# Patient Record
Sex: Female | Born: 1995 | Race: White | Hispanic: No | Marital: Single | Smoking: Former smoker
Health system: Southern US, Community
[De-identification: ages and names within clinical notes are randomized; demographics above are authoritative.]

## PROBLEM LIST (undated history)

## (undated) DIAGNOSIS — N390 Urinary tract infection, site not specified: Secondary | ICD-10-CM

## (undated) DIAGNOSIS — F419 Anxiety disorder, unspecified: Secondary | ICD-10-CM

## (undated) DIAGNOSIS — F32A Depression, unspecified: Secondary | ICD-10-CM

## (undated) HISTORY — PX: APPENDECTOMY: SHX54

---
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
## 2020-12-22 ENCOUNTER — Emergency Department (HOSPITAL_BASED_OUTPATIENT_CLINIC_OR_DEPARTMENT_OTHER)
Admission: EM | Admit: 2020-12-22 | Discharge: 2020-12-22 | Disposition: A | Discharge status: Home / Self Care | Payer: Commercial Managed Care - PPO | Attending: Emergency Medicine | Admitting: Emergency Medicine

## 2020-12-22 ENCOUNTER — Encounter (HOSPITAL_BASED_OUTPATIENT_CLINIC_OR_DEPARTMENT_OTHER): Payer: Self-pay | Admitting: *Deleted

## 2020-12-22 ENCOUNTER — Other Ambulatory Visit: Payer: Self-pay

## 2020-12-22 DIAGNOSIS — R112 Nausea with vomiting, unspecified: Secondary | ICD-10-CM | POA: Diagnosis present

## 2020-12-22 DIAGNOSIS — R109 Unspecified abdominal pain: Secondary | ICD-10-CM | POA: Insufficient documentation

## 2020-12-22 HISTORY — DX: Depression, unspecified: F32.A

## 2020-12-22 LAB — CBC
HCT: 43.7 % (ref 36.0–46.0)
Hemoglobin: 14.3 g/dL (ref 12.0–15.0)
MCH: 27.5 pg (ref 26.0–34.0)
MCHC: 32.7 g/dL (ref 30.0–36.0)
MCV: 84 fL (ref 80.0–100.0)
Platelets: 378 10*3/uL (ref 150–400)
RBC: 5.2 MIL/uL — ABNORMAL HIGH (ref 3.87–5.11)
RDW: 13.4 % (ref 11.5–15.5)
WBC: 17.1 10*3/uL — ABNORMAL HIGH (ref 4.0–10.5)
nRBC: 0 % (ref 0.0–0.2)

## 2020-12-22 LAB — URINALYSIS, ROUTINE W REFLEX MICROSCOPIC
Bilirubin Urine: NEGATIVE
Glucose, UA: NEGATIVE mg/dL
Hgb urine dipstick: NEGATIVE
Ketones, ur: 40 mg/dL — AB
Leukocytes,Ua: NEGATIVE
Nitrite: NEGATIVE
Protein, ur: NEGATIVE mg/dL
Specific Gravity, Urine: 1.01 (ref 1.005–1.030)
pH: 8 (ref 5.0–8.0)

## 2020-12-22 LAB — COMPREHENSIVE METABOLIC PANEL
ALT: 20 U/L (ref 0–44)
AST: 27 U/L (ref 15–41)
Albumin: 4.7 g/dL (ref 3.5–5.0)
Alkaline Phosphatase: 78 U/L (ref 38–126)
Anion gap: 10 (ref 5–15)
BUN: 16 mg/dL (ref 6–20)
CO2: 25 mmol/L (ref 22–32)
Calcium: 9.4 mg/dL (ref 8.9–10.3)
Chloride: 105 mmol/L (ref 98–111)
Creatinine, Ser: 0.57 mg/dL (ref 0.44–1.00)
GFR, Estimated: >60 mL/min (ref >60)
Glucose, Bld: 114 mg/dL — ABNORMAL HIGH (ref 70–99)
Potassium: 3.3 mmol/L — ABNORMAL LOW (ref 3.5–5.1)
Sodium: 140 mmol/L (ref 135–145)
Total Bilirubin: 0.4 mg/dL (ref 0.3–1.2)
Total Protein: 7.9 g/dL (ref 6.5–8.1)

## 2020-12-22 LAB — LIPASE, BLOOD: Lipase: 21 U/L (ref 11–51)

## 2020-12-22 LAB — PREGNANCY, URINE: Preg Test, Ur: NEGATIVE

## 2020-12-22 MED ORDER — ONDANSETRON 4 MG PO TBDP: 8.0000 mg | ORAL_TABLET | Freq: Once | ORAL | Status: DC

## 2020-12-22 MED ORDER — ONDANSETRON 4 MG PO TBDP
4.0000 mg | ORAL_TABLET | Freq: Once | ORAL | Status: AC
  Administered 2020-12-22: 4 mg via ORAL
  Filled 2020-12-22: qty 1

## 2020-12-22 MED ORDER — ONDANSETRON 4 MG PO TBDP: 4.0000 mg | ORAL_TABLET | Freq: Three times a day (TID) | ORAL | 0 refills | Status: DC | PRN

## 2020-12-22 NOTE — ED Triage Notes (Signed)
Patient has been drinking wine and Saki last night and was vomiting since then until this morning.

## 2020-12-22 NOTE — ED Notes (Signed)
MD at the bedside  

## 2020-12-22 NOTE — ED Notes (Signed)
Patient Alert and oriented to baseline. Stable and ambulatory to baseline. Patient verbalized understanding of the discharge instructions.  Patient belongings were taken by the patient.   

## 2020-12-22 NOTE — ED Provider Notes (Signed)
[facility] EMERGENCY DEPARTMENT Provider Note  CSN: [id number] Arrival date & time: 12/22/20 1024    History Chief Complaint  Patient presents with   Emesis    Helen Foster is a 25 y.o. female with no significant PMH reports she had been drinking alcohol last night began vomiting during the night and into this morning. Some abdominal cramping with vomiting but otherwise no abdominal pain, hematemesis or hematochezia/melena. She is feeling some better now. No fever, cough, CP or SOB.    Past Medical History:  Diagnosis Date   Depression     History reviewed. No pertinent surgical history.  History reviewed. No pertinent family history.  Social History   Tobacco Use   Smoking status: Never   Smokeless tobacco: Never  Vaping Use   Vaping Use: Never used  Substance Use Topics   Alcohol use: No   Drug use: Not Currently    Types: Marijuana     Home Medications Prior to Admission medications   Medication Sig Start Date End Date Taking? Authorizing Provider  famotidine (PEPCID) 20 MG tablet Take 20 mg by mouth 2 (two) times daily.   Yes [provider]  FLUoxetine (PROZAC) 10 MG tablet Take 10 mg by mouth daily.   Yes [provider]  ondansetron (ZOFRAN ODT) 4 MG disintegrating tablet Take 1 tablet (4 mg total) by mouth every 8 (eight) hours as needed for nausea or vomiting. 12/22/20  Yes Pollyann Savoy, MD  phenazopyridine (PYRIDIUM) 200 MG tablet Take 1 tablet (200 mg total) by mouth 3 (three) times daily. 12/02/13   Molpus, Jonny Ruiz, MD     Allergies    Patient has no known allergies.   Review of Systems   Review of Systems A comprehensive review of systems was completed and negative except as noted in HPI.    Physical Exam BP 121/74   Pulse 67   Temp 97.7 F (36.5 C) (Oral)   Resp 18   Ht 5\' 1"  (1.549 m)   Wt 52.2 kg   LMP 12/15/2020   SpO2 97%   BMI 21.73 kg/m   Physical Exam Vitals and nursing note reviewed.   Constitutional:      Appearance: Normal appearance.  HENT:     Head: Normocephalic and atraumatic.     Nose: Nose normal.     Mouth/Throat:     Mouth: Mucous membranes are moist.  Eyes:     Extraocular Movements: Extraocular movements intact.     Conjunctiva/sclera: Conjunctivae normal.  Cardiovascular:     Rate and Rhythm: Normal rate.  Pulmonary:     Effort: Pulmonary effort is normal.     Breath sounds: Normal breath sounds.  Abdominal:     General: Abdomen is flat.     Palpations: Abdomen is soft.     Tenderness: There is no abdominal tenderness.  Musculoskeletal:        General: No swelling. Normal range of motion.     Cervical back: Neck supple.  Skin:    General: Skin is warm and dry.  Neurological:     General: No focal deficit present.     Mental Status: She is alert.  Psychiatric:        Mood and Affect: Mood normal.     ED Results / Procedures / Treatments   Labs (all labs ordered are listed, but only abnormal results are displayed) Labs Reviewed  COMPREHENSIVE METABOLIC PANEL - Abnormal; Notable for the following components:  Result Value   Potassium 3.3 (*)    Glucose, Bld 114 (*)    All other components within normal limits  CBC - Abnormal; Notable for the following components:   WBC 17.1 (*)    RBC 5.20 (*)    All other components within normal limits  URINALYSIS, ROUTINE W REFLEX MICROSCOPIC - Abnormal; Notable for the following components:   Ketones, ur 40 (*)    All other components within normal limits  LIPASE, BLOOD  PREGNANCY, URINE    EKG None   Radiology No results found.  Procedures Procedures  Medications Ordered in the ED Medications  ondansetron (ZOFRAN-ODT) disintegrating tablet 4 mg (4 mg Oral Given 12/22/20 1241)     MDM Rules/Calculators/A&P MDM Labs ordered in triage reviewed, mild leukocytosis and mod ketones in urine but otherwise unremarkable. She is feeling better after ODT Zofran, will give PO trial and  anticipate discharge if she tolerates.   ED Course  I have reviewed the triage vital signs and the nursing notes.  Pertinent labs & imaging results that were available during my care of the patient were reviewed by me and considered in my medical decision making (see chart for details).  Clinical Course as of 12/22/20 1324  Sun Dec 22, 2020  1322 Patient tolerating PO fluids and ready to go home. Rx for Zofran as needed. RTED for any other concerns.  [CS]    Clinical Course User Index [CS] Pollyann Savoy, MD    Final Clinical Impression(s) / ED Diagnoses Final diagnoses:  Non-intractable vomiting with nausea, unspecified vomiting type    Rx / DC Orders ED Discharge Orders          Ordered    ondansetron (ZOFRAN ODT) 4 MG disintegrating tablet  Every 8 hours PRN        12/22/20 1323             Pollyann Savoy, MD 12/22/20 1324

## 2022-04-06 NOTE — L&D Delivery Note (Signed)
Delivery Note After approximately 20 minutes of pushing, at 6:58 PM a viable female "Helen Foster" was delivered via Vaginal, Spontaneous (Presentation:   Occiput Anterior).  APGAR: 9, 9; weight pending. Head crowned and shoulders delivered easily within the same contraction. An umbilical cord was noted around the infants left ankle. The cord was clamped and cut after 1 minute and baby placed on mother's abdomen.   Placenta status: Spontaneous, Intact.  Cord: 3 vessels with the following complications: None.  Cord pH: N/A  Anesthesia: Epidural Episiotomy: None Lacerations: 2nd degree;Perineal Suture Repair: 2.0 Est. Blood Loss (mL): 150  Mom to postpartum.  Baby to Couplet care / Skin to Skin.  Junius Creamer 03/10/2023, 7:16 PM

## 2022-08-26 LAB — OB RESULTS CONSOLE HIV ANTIBODY (ROUTINE TESTING): HIV: NONREACTIVE

## 2022-08-26 LAB — OB RESULTS CONSOLE RPR: RPR: NONREACTIVE

## 2022-08-26 LAB — OB RESULTS CONSOLE RUBELLA ANTIBODY, IGM: Rubella: IMMUNE

## 2022-08-26 LAB — OB RESULTS CONSOLE GC/CHLAMYDIA
Chlamydia: NEGATIVE
Neisseria Gonorrhea: NEGATIVE

## 2022-08-26 LAB — OB RESULTS CONSOLE HEPATITIS B SURFACE ANTIGEN: Hepatitis B Surface Ag: NEGATIVE

## 2023-02-18 LAB — OB RESULTS CONSOLE GBS: GBS: NEGATIVE

## 2023-03-10 ENCOUNTER — Inpatient Hospital Stay (HOSPITAL_COMMUNITY)
Admission: AD | Admit: 2023-03-10 | Discharge: 2023-03-10 | Disposition: A | Discharge status: Home / Self Care | Payer: Commercial Managed Care - PPO | Source: Home / Self Care | Attending: Obstetrics and Gynecology | Admitting: Obstetrics and Gynecology

## 2023-03-10 ENCOUNTER — Inpatient Hospital Stay (HOSPITAL_COMMUNITY)
Admission: AD | Admit: 2023-03-10 | Discharge: 2023-03-12 | DRG: 807 | Disposition: A | Discharge status: Home / Self Care | Payer: Commercial Managed Care - PPO | Attending: Student | Admitting: Student

## 2023-03-10 ENCOUNTER — Encounter (HOSPITAL_COMMUNITY): Payer: Self-pay | Admitting: Obstetrics and Gynecology

## 2023-03-10 ENCOUNTER — Inpatient Hospital Stay (HOSPITAL_COMMUNITY): Payer: Commercial Managed Care - PPO | Admitting: Anesthesiology

## 2023-03-10 ENCOUNTER — Encounter (HOSPITAL_COMMUNITY): Payer: Self-pay | Admitting: Student

## 2023-03-10 ENCOUNTER — Other Ambulatory Visit: Payer: Self-pay

## 2023-03-10 DIAGNOSIS — O99344 Other mental disorders complicating childbirth: Secondary | ICD-10-CM | POA: Diagnosis present

## 2023-03-10 DIAGNOSIS — O479 False labor, unspecified: Secondary | ICD-10-CM

## 2023-03-10 DIAGNOSIS — Z87891 Personal history of nicotine dependence: Secondary | ICD-10-CM

## 2023-03-10 DIAGNOSIS — Z3A39 39 weeks gestation of pregnancy: Secondary | ICD-10-CM | POA: Insufficient documentation

## 2023-03-10 DIAGNOSIS — O9962 Diseases of the digestive system complicating childbirth: Secondary | ICD-10-CM | POA: Diagnosis present

## 2023-03-10 DIAGNOSIS — O471 False labor at or after 37 completed weeks of gestation: Secondary | ICD-10-CM | POA: Insufficient documentation

## 2023-03-10 DIAGNOSIS — O9902 Anemia complicating childbirth: Secondary | ICD-10-CM | POA: Diagnosis present

## 2023-03-10 DIAGNOSIS — O26893 Other specified pregnancy related conditions, third trimester: Secondary | ICD-10-CM | POA: Diagnosis present

## 2023-03-10 DIAGNOSIS — K219 Gastro-esophageal reflux disease without esophagitis: Secondary | ICD-10-CM | POA: Diagnosis present

## 2023-03-10 DIAGNOSIS — F603 Borderline personality disorder: Secondary | ICD-10-CM | POA: Diagnosis present

## 2023-03-10 HISTORY — DX: Anxiety disorder, unspecified: F41.9

## 2023-03-10 HISTORY — DX: Urinary tract infection, site not specified: N39.0

## 2023-03-10 LAB — TYPE AND SCREEN
ABO/RH(D): O POS
Antibody Screen: NEGATIVE

## 2023-03-10 LAB — CBC
HCT: 33.9 % — ABNORMAL LOW (ref 36.0–46.0)
Hemoglobin: 10.7 g/dL — ABNORMAL LOW (ref 12.0–15.0)
MCH: 25.7 pg — ABNORMAL LOW (ref 26.0–34.0)
MCHC: 31.6 g/dL (ref 30.0–36.0)
MCV: 81.5 fL (ref 80.0–100.0)
Platelets: 257 10*3/uL (ref 150–400)
RBC: 4.16 MIL/uL (ref 3.87–5.11)
RDW: 16.3 % — ABNORMAL HIGH (ref 11.5–15.5)
WBC: 15.7 10*3/uL — ABNORMAL HIGH (ref 4.0–10.5)
nRBC: 0 % (ref 0.0–0.2)

## 2023-03-10 LAB — RPR: RPR Ser Ql: NONREACTIVE

## 2023-03-10 MED ORDER — SIMETHICONE 80 MG PO CHEW: 80.0000 mg | CHEWABLE_TABLET | ORAL | Status: DC | PRN

## 2023-03-10 MED ORDER — FLUOXETINE HCL 20 MG PO CAPS
20.0000 mg | ORAL_CAPSULE | Freq: Every day | ORAL | Status: DC
  Filled 2023-03-10: qty 1

## 2023-03-10 MED ORDER — ONDANSETRON HCL 4 MG PO TABS: 4.0000 mg | ORAL_TABLET | ORAL | Status: DC | PRN

## 2023-03-10 MED ORDER — ACETAMINOPHEN 325 MG PO TABS
650.0000 mg | ORAL_TABLET | ORAL | Status: DC | PRN
  Administered 2023-03-11: 650 mg via ORAL
  Filled 2023-03-10: qty 2

## 2023-03-10 MED ORDER — FLEET ENEMA RE ENEM: 1.0000 | ENEMA | RECTAL | Status: DC | PRN

## 2023-03-10 MED ORDER — FENTANYL-BUPIVACAINE-NACL 0.5-0.125-0.9 MG/250ML-% EP SOLN
EPIDURAL | Status: AC
  Filled 2023-03-10: qty 250

## 2023-03-10 MED ORDER — LACTATED RINGERS IV SOLN: INTRAVENOUS | Status: DC

## 2023-03-10 MED ORDER — TETANUS-DIPHTH-ACELL PERTUSSIS 5-2.5-18.5 LF-MCG/0.5 IM SUSY
0.5000 mL | PREFILLED_SYRINGE | Freq: Once | INTRAMUSCULAR | Status: DC
  Filled 2023-03-10: qty 0.5

## 2023-03-10 MED ORDER — PRENATAL MULTIVITAMIN CH
1.0000 | ORAL_TABLET | Freq: Every day | ORAL | Status: DC
  Administered 2023-03-11 – 2023-03-12 (×2): 1 via ORAL
  Filled 2023-03-10 (×2): qty 1

## 2023-03-10 MED ORDER — PHENYLEPHRINE 80 MCG/ML (10ML) SYRINGE FOR IV PUSH (FOR BLOOD PRESSURE SUPPORT): 80.0000 ug | PREFILLED_SYRINGE | INTRAVENOUS | Status: DC | PRN

## 2023-03-10 MED ORDER — LAMOTRIGINE 100 MG PO TABS
100.0000 mg | ORAL_TABLET | Freq: Every day | ORAL | Status: DC
Start: 2023-03-10 — End: 2023-03-10
  Filled 2023-03-10: qty 1

## 2023-03-10 MED ORDER — OXYTOCIN BOLUS FROM INFUSION
333.0000 mL | Freq: Once | INTRAVENOUS | Status: AC
  Administered 2023-03-10: 333 mL via INTRAVENOUS

## 2023-03-10 MED ORDER — ONDANSETRON HCL 4 MG/2ML IJ SOLN: 4.0000 mg | INTRAMUSCULAR | Status: DC | PRN

## 2023-03-10 MED ORDER — BENZOCAINE-MENTHOL 20-0.5 % EX AERO: 1.0000 | INHALATION_SPRAY | CUTANEOUS | Status: DC | PRN

## 2023-03-10 MED ORDER — OXYTOCIN-SODIUM CHLORIDE 30-0.9 UT/500ML-% IV SOLN
1.0000 m[IU]/min | INTRAVENOUS | Status: DC
  Administered 2023-03-10: 2 m[IU]/min via INTRAVENOUS

## 2023-03-10 MED ORDER — WITCH HAZEL-GLYCERIN EX PADS: 1.0000 | MEDICATED_PAD | CUTANEOUS | Status: DC | PRN

## 2023-03-10 MED ORDER — DIPHENHYDRAMINE HCL 25 MG PO CAPS: 25.0000 mg | ORAL_CAPSULE | Freq: Four times a day (QID) | ORAL | Status: DC | PRN

## 2023-03-10 MED ORDER — ZOLPIDEM TARTRATE 5 MG PO TABS: 5.0000 mg | ORAL_TABLET | Freq: Every evening | ORAL | Status: DC | PRN

## 2023-03-10 MED ORDER — IBUPROFEN 600 MG PO TABS
600.0000 mg | ORAL_TABLET | Freq: Four times a day (QID) | ORAL | Status: DC
  Administered 2023-03-11 – 2023-03-12 (×6): 600 mg via ORAL
  Filled 2023-03-10 (×6): qty 1

## 2023-03-10 MED ORDER — OXYCODONE-ACETAMINOPHEN 5-325 MG PO TABS
1.0000 | ORAL_TABLET | Freq: Four times a day (QID) | ORAL | Status: DC | PRN
Start: 2023-03-10 — End: 2023-03-12

## 2023-03-10 MED ORDER — LIDOCAINE HCL (PF) 1 % IJ SOLN: 30.0000 mL | INTRAMUSCULAR | Status: DC | PRN

## 2023-03-10 MED ORDER — DIPHENHYDRAMINE HCL 50 MG/ML IJ SOLN: 12.5000 mg | INTRAMUSCULAR | Status: DC | PRN

## 2023-03-10 MED ORDER — LACTATED RINGERS IV SOLN: 500.0000 mL | Freq: Once | INTRAVENOUS | Status: DC

## 2023-03-10 MED ORDER — SENNOSIDES-DOCUSATE SODIUM 8.6-50 MG PO TABS
2.0000 | ORAL_TABLET | Freq: Every day | ORAL | Status: DC
  Administered 2023-03-11 – 2023-03-12 (×2): 2 via ORAL
  Filled 2023-03-10 (×2): qty 2

## 2023-03-10 MED ORDER — OXYTOCIN-SODIUM CHLORIDE 30-0.9 UT/500ML-% IV SOLN
2.5000 [IU]/h | INTRAVENOUS | Status: DC
  Filled 2023-03-10: qty 500

## 2023-03-10 MED ORDER — EPHEDRINE 5 MG/ML INJ: 10.0000 mg | INTRAVENOUS | Status: DC | PRN

## 2023-03-10 MED ORDER — FENTANYL-BUPIVACAINE-NACL 0.5-0.125-0.9 MG/250ML-% EP SOLN
12.0000 mL/h | EPIDURAL | Status: DC | PRN
  Administered 2023-03-10: 12 mL/h via EPIDURAL

## 2023-03-10 MED ORDER — OXYCODONE-ACETAMINOPHEN 5-325 MG PO TABS
2.0000 | ORAL_TABLET | ORAL | Status: DC | PRN
Start: 2023-03-10 — End: 2023-03-10

## 2023-03-10 MED ORDER — ACETAMINOPHEN 325 MG PO TABS: 650.0000 mg | ORAL_TABLET | ORAL | Status: DC | PRN

## 2023-03-10 MED ORDER — ONDANSETRON HCL 4 MG/2ML IJ SOLN
4.0000 mg | Freq: Four times a day (QID) | INTRAMUSCULAR | Status: DC | PRN
  Administered 2023-03-10: 4 mg via INTRAVENOUS
  Filled 2023-03-10: qty 2

## 2023-03-10 MED ORDER — OXYCODONE-ACETAMINOPHEN 5-325 MG PO TABS
1.0000 | ORAL_TABLET | Freq: Once | ORAL | Status: AC
  Administered 2023-03-10: 1 via ORAL
  Filled 2023-03-10: qty 1

## 2023-03-10 MED ORDER — LIDOCAINE HCL (PF) 1 % IJ SOLN
INTRAMUSCULAR | Status: DC | PRN
  Administered 2023-03-10 (×2): 4 mL via EPIDURAL

## 2023-03-10 MED ORDER — DIBUCAINE (PERIANAL) 1 % EX OINT: 1.0000 | TOPICAL_OINTMENT | CUTANEOUS | Status: DC | PRN

## 2023-03-10 MED ORDER — TERBUTALINE SULFATE 1 MG/ML IJ SOLN: 0.2500 mg | Freq: Once | INTRAMUSCULAR | Status: DC | PRN

## 2023-03-10 MED ORDER — LACTATED RINGERS IV SOLN: 500.0000 mL | INTRAVENOUS | Status: DC | PRN

## 2023-03-10 MED ORDER — COCONUT OIL OIL
1.0000 | TOPICAL_OIL | Status: DC | PRN
  Administered 2023-03-12: 1 via TOPICAL

## 2023-03-10 MED ORDER — OXYCODONE-ACETAMINOPHEN 5-325 MG PO TABS: 1.0000 | ORAL_TABLET | ORAL | Status: DC | PRN

## 2023-03-10 MED ORDER — SOD CITRATE-CITRIC ACID 500-334 MG/5ML PO SOLN: 30.0000 mL | ORAL | Status: DC | PRN

## 2023-03-10 NOTE — Anesthesia Procedure Notes (Signed)
Epidural Patient location during procedure: OB Start time: 03/10/2023 10:24 AM End time: 03/10/2023 10:29 AM  Staffing Anesthesiologist: Linton Rump, MD Performed: anesthesiologist   Preanesthetic Checklist Completed: patient identified, IV checked, site marked, risks and benefits discussed, surgical consent, monitors and equipment checked, pre-op evaluation and timeout performed  Epidural Patient position: sitting Prep: DuraPrep and site prepped and draped Patient monitoring: continuous pulse ox and blood pressure Approach: midline Location: L3-L4 Injection technique: LOR saline  Needle:  Needle type: Tuohy  Needle gauge: 17 G Needle length: 9 cm and 9 Needle insertion depth: 5 cm Catheter type: closed end flexible Catheter size: 19 Gauge Catheter at skin depth: 9 cm Test dose: negative  Assessment Events: blood not aspirated, no cerebrospinal fluid, injection not painful, no injection resistance, no paresthesia and negative IV test  Additional Notes The patient has requested an epidural for labor pain management. Risks and benefits including, but not limited to, infection, bleeding, local anesthetic toxicity, headache, hypotension, back pain, block failure, etc. were discussed with the patient. The patient expressed understanding and consented to the procedure. I confirmed that the patient has no bleeding disorders and is not taking blood thinners. I confirmed the patient's last platelet count with the nurse. A time-out was performed immediately prior to the procedure. Please see nursing documentation for vital signs. Sterile technique was used throughout the whole procedure. Once LOR achieved, the epidural catheter threaded easily without resistance. Aspiration of the catheter was negative for blood and CSF. The epidural was dosed slowly and an infusion was started.  1 attempt(s)Reason for block:procedure for pain

## 2023-03-10 NOTE — Progress Notes (Signed)
Helen Foster is a 27 y.o. G1P0 at [redacted]w[redacted]d   Objective: BP 120/66   Pulse 97   Temp 98.5 F (36.9 C) (Oral)   Resp 16   Ht 5\' 1"  (1.549 m)   Wt 78 kg   SpO2 99%   BMI 32.50 kg/m  No intake/output data recorded. No intake/output data recorded.  FHT:  FHR: 130 bpm, variability: moderate,  accelerations:  Present,  decelerations:  Absent UC:   irregular, every 1-4 minutes SVE:   Dilation: 8 Effacement (%): 90 Station: -1 Exam by:: Dr. Katrinka Blazing  Labs: Lab Results  Component Value Date   WBC 15.7 (H) 03/10/2023   HGB 10.7 (L) 03/10/2023   HCT 33.9 (L) 03/10/2023   MCV 81.5 03/10/2023   PLT 257 03/10/2023    Assessment / Plan: 27 yo G1P0 @ 39.5 with AOL  Labor: Progressing normally. Start pitocin Preeclampsia:   N/A Fetal Wellbeing:  Category I Pain Control:  Epidural I/D:  n/a Anticipated MOD:  NSVD  Junius Creamer, DO 03/10/2023, 4:14 PM

## 2023-03-10 NOTE — Anesthesia Preprocedure Evaluation (Signed)
Anesthesia Evaluation  Patient identified by MRN, date of birth, ID band Patient awake    Reviewed: Allergy & Precautions, NPO status , Patient's Chart, lab work & pertinent test results  History of Anesthesia Complications Negative for: history of anesthetic complications  Airway Mallampati: III  TM Distance: >3 FB Neck ROM: Full    Dental   Pulmonary former smoker   Pulmonary exam normal breath sounds clear to auscultation       Cardiovascular negative cardio ROS  Rhythm:Regular Rate:Normal     Neuro/Psych  PSYCHIATRIC DISORDERS Anxiety Depression    negative neurological ROS     GI/Hepatic Neg liver ROS,GERD  Medicated,,  Endo/Other  negative endocrine ROS    Renal/GU negative Renal ROS     Musculoskeletal   Abdominal   Peds  Hematology negative hematology ROS (+) Lab Results      Component                Value               Date                      WBC                      15.7 (H)            03/10/2023                HGB                      10.7 (L)            03/10/2023                HCT                      33.9 (L)            03/10/2023                MCV                      81.5                03/10/2023                PLT                      257                 03/10/2023              Anesthesia Other Findings   Reproductive/Obstetrics (+) Pregnancy                              Anesthesia Physical Anesthesia Plan  ASA: 2  Anesthesia Plan: Epidural   Post-op Pain Management:    Induction:   PONV Risk Score and Plan:   Airway Management Planned: Natural Airway  Additional Equipment:   Intra-op Plan:   Post-operative Plan:   Informed Consent: I have reviewed the patients History and Physical, chart, labs and discussed the procedure including the risks, benefits and alternatives for the proposed anesthesia with the patient or authorized representative who has  indicated his/her understanding and acceptance.       Plan Discussed with: Anesthesiologist  Anesthesia Plan  Comments: (I have discussed risks of neuraxial anesthesia including but not limited to infection, bleeding, nerve injury, back pain, headache, seizures, and failure of block. Patient denies bleeding disorders and is not currently anticoagulated. Labs have been reviewed. Risks and benefits discussed. All patient's questions answered.  )         Anesthesia Quick Evaluation

## 2023-03-10 NOTE — H&P (Signed)
Helen Foster is a 27 y.o. female presenting for augmentation of labor.  Pregnancy is complicated by:  - Borderline personality disorder: managed by psych. Stable on lamotrigine and fluoxetine. 3rd trimester growth AGA - Prior cigarette/THC use: stopped in pregnancy OB History     Gravida  1   Para      Term      Preterm      AB      Living         SAB      IAB      Ectopic      Multiple      Live Births             Past Medical History:  Diagnosis Date   Anxiety    Depression    UTI (urinary tract infection)    Past Surgical History:  Procedure Laterality Date   APPENDECTOMY     Family History: family history includes Healthy in her father and mother. Social History:  reports that she has quit smoking. Her smoking use included cigarettes. She has never used smokeless tobacco. She reports that she does not currently use alcohol. She reports that she does not currently use drugs after having used the following drugs: Marijuana.     Maternal Diabetes: No Genetic Screening: Normal Maternal Ultrasounds/Referrals: Normal Fetal Ultrasounds or other Referrals:  None Maternal Substance Abuse:  No Significant Maternal Medications:  Meds include: Other: Lamotrigine/Fluoxetine Significant Maternal Lab Results:  Group B Strep negative Number of Prenatal Visits:greater than 3 verified prenatal visits Maternal Vaccinations:TDap and Flu Other Comments:  None  Review of Systems  Constitutional:  Negative for chills and fever.  Gastrointestinal:  Positive for abdominal pain. Negative for constipation.  Genitourinary:  Positive for pelvic pain. Negative for vaginal bleeding and vaginal discharge.  Neurological:  Negative for light-headedness and headaches.   History Dilation: 5.5 Effacement (%): 100 Station: 0 Exam by:: Kathy Blood pressure 126/62, pulse 76, temperature 98.6 F (37 C), temperature source Oral, resp. rate 18, height 5\' 1"  (1.549 m), weight  78 kg, SpO2 99%. Exam Physical Exam Constitutional:      General: She is not in acute distress.    Appearance: Normal appearance.  HENT:     Head: Normocephalic and atraumatic.  Pulmonary:     Effort: Pulmonary effort is normal.  Musculoskeletal:        General: Normal range of motion.  Skin:    General: Skin is warm and dry.  Neurological:     Mental Status: She is alert.  Psychiatric:        Mood and Affect: Mood normal.        Behavior: Behavior normal.     Prenatal labs: ABO, Rh: --/--/O POS (12/04 1610) Antibody: NEG (12/04 0934) Rubella: Immune (05/22 0000) RPR: Nonreactive (05/22 0000)  HBsAg: Negative (05/22 0000)  HIV: Non-reactive (05/22 0000)  GBS: Negative/-- (11/14 0000)   Assessment/Plan: 27 yo G1P0 @ 39.5 presents in latent labor for augmentation  - Labor: s/p recent epidural. AROM once comfortable  - GBS negative    Junius Creamer 03/10/2023, 11:08 AM

## 2023-03-10 NOTE — MAU Note (Signed)
.  Helen Foster is a 27 y.o. at [redacted]w[redacted]d here in MAU reporting ctxs since 2300. Reports good FM and denies LOF. Some bloody show.   Onset of complaint: 2300 Pain score: 10 Vitals:   03/10/23 0340 03/10/23 0342  BP:  123/74  Pulse: 77   Resp: 18   Temp: 97.9 F (36.6 C)   SpO2: 99%      FHT:138 Lab orders placed from triage: labor eval

## 2023-03-10 NOTE — MAU Provider Note (Signed)
S: Ms. Helen Foster is a 27 y.o. G1P0 at [redacted]w[redacted]d  who presents to MAU today for labor evaluation.     Cervical exam by RN:  Dilation: 1 Effacement (%): 70 Station: -2 Exam by:: Joline Salt, RN  Fetal Monitoring: Baseline: 140 Variability: average Accelerations: present Decelerations: absent Contractions: irregular  MDM Discussed patient with RN. NST reviewed.   A: SIUP at [redacted]w[redacted]d  False labor  P: Discharge home Labor precautions and kick counts included in AVS Patient to follow-up with office as scheduled  Patient may return to MAU as needed or when in labor   Aviva Signs, [place] 03/10/2023 5:10 AM

## 2023-03-11 LAB — CBC
HCT: 28.9 % — ABNORMAL LOW (ref 36.0–46.0)
Hemoglobin: 8.7 g/dL — ABNORMAL LOW (ref 12.0–15.0)
MCH: 25.3 pg — ABNORMAL LOW (ref 26.0–34.0)
MCHC: 30.1 g/dL (ref 30.0–36.0)
MCV: 84 fL (ref 80.0–100.0)
Platelets: 235 10*3/uL (ref 150–400)
RBC: 3.44 MIL/uL — ABNORMAL LOW (ref 3.87–5.11)
RDW: 16.9 % — ABNORMAL HIGH (ref 11.5–15.5)
WBC: 18.3 10*3/uL — ABNORMAL HIGH (ref 4.0–10.5)
nRBC: 0.1 % (ref 0.0–0.2)

## 2023-03-11 NOTE — Plan of Care (Signed)
  Problem: Education: Goal: Knowledge of General Education information will improve Description: Including pain rating scale, medication(s)/side effects and non-pharmacologic comfort measures Outcome: Progressing   Problem: Health Behavior/Discharge Planning: Goal: Ability to manage health-related needs will improve Outcome: Progressing   Problem: Clinical Measurements: Goal: Ability to maintain clinical measurements within normal limits will improve Outcome: Progressing Goal: Will remain free from infection Outcome: Progressing Goal: Diagnostic test results will improve Outcome: Progressing Goal: Respiratory complications will improve Outcome: Progressing Goal: Cardiovascular complication will be avoided Outcome: Progressing   Problem: Activity: Goal: Risk for activity intolerance will decrease Outcome: Progressing   Problem: Nutrition: Goal: Adequate nutrition will be maintained Outcome: Progressing   Problem: Coping: Goal: Level of anxiety will decrease Outcome: Progressing   Problem: Elimination: Goal: Will not experience complications related to bowel motility Outcome: Progressing Goal: Will not experience complications related to urinary retention Outcome: Progressing   Problem: Pain Management: Goal: General experience of comfort will improve Outcome: Progressing   Problem: Safety: Goal: Ability to remain free from injury will improve Outcome: Progressing   Problem: Skin Integrity: Goal: Risk for impaired skin integrity will decrease Outcome: Progressing   Problem: Education: Goal: Knowledge of condition will improve Outcome: Progressing Goal: Individualized Educational Video(s) Outcome: Progressing Goal: Individualized Newborn Educational Video(s) Outcome: Progressing   Problem: Activity: Goal: Will verbalize the importance of balancing activity with adequate rest periods Outcome: Progressing Goal: Ability to tolerate increased activity will  improve Outcome: Progressing   Problem: Coping: Goal: Ability to identify and utilize available resources and services will improve Outcome: Progressing   Problem: Life Cycle: Goal: Chance of risk for complications during the postpartum period will decrease Outcome: Progressing   Problem: Role Relationship: Goal: Ability to demonstrate positive interaction with newborn will improve Outcome: Progressing   Problem: Skin Integrity: Goal: Demonstration of wound healing without infection will improve Outcome: Progressing

## 2023-03-11 NOTE — Anesthesia Postprocedure Evaluation (Signed)
Anesthesia Post Note  Patient: Helen Foster  Procedure(s) Performed: AN AD HOC LABOR EPIDURAL     Patient location during evaluation: Mother Baby Anesthesia Type: Epidural Level of consciousness: awake and alert and oriented Pain management: satisfactory to patient Vital Signs Assessment: post-procedure vital signs reviewed and stable Respiratory status: respiratory function stable Cardiovascular status: stable Postop Assessment: no headache, no backache, epidural receding, patient able to bend at knees, no signs of nausea or vomiting, adequate PO intake and able to ambulate Anesthetic complications: no   No notable events documented.  Last Vitals:  Vitals:   03/11/23 0222 03/11/23 0611  BP: 116/71 124/78  Pulse: 99 90  Resp: 17   Temp: 37.3 C 37.1 C  SpO2: 100% 98%    Last Pain:  Vitals:   03/11/23 0614  TempSrc:   PainSc: 0-No pain   Pain Goal:                   Zorion Nims

## 2023-03-11 NOTE — Lactation Note (Signed)
This note was copied from a baby's chart. Lactation Consultation Note  Patient Name: Helen Foster [id number] Date: 03/11/2023 Age:25 hours  Mother was eligible for a stork pump and pump was delivered to mother's room by Licensed conveyancer.    Consult Status Consult Status: Follow-up Date: 03/12/23 Follow-up type: In-patient    Christella Hartigan M 03/11/2023, 11:34 AM

## 2023-03-11 NOTE — Progress Notes (Signed)
Post Partum Day 1 Subjective: no complaints, up ad lib, voiding, and tolerating PO  Objective: Blood pressure 126/72, pulse 82, temperature 98.2 F (36.8 C), temperature source Oral, resp. rate 18, height 5\' 1"  (1.549 m), weight 78 kg, SpO2 100%, unknown if currently breastfeeding.  Physical Exam:  General: alert, cooperative, and appears stated age Lochia: appropriate Uterine Fundus: firm DVT Evaluation: No evidence of DVT seen on physical exam.  Recent Labs    03/10/23 0934 03/11/23 0548  HGB 10.7* 8.7*  HCT 33.9* 28.9*    Assessment/Plan: Plan for discharge tomorrow and Breastfeeding Desires neonatal circumcision, R/B/A of procedure discussed at length. Pt understands that neonatal circumcision is not considered medically necessary and is elective. The risks include, but are not limited to bleeding, infection, damage to the penis, development of scar tissue, and having to have it redone at a later date. Pt understands theses risks and wishes to proceed    LOS: 1 day   Waynard Reeds, MD 03/11/2023, 12:20 PM

## 2023-03-11 NOTE — Plan of Care (Signed)
Problem: Education: Goal: Knowledge of General Education information will improve Description: Including pain rating scale, medication(s)/side effects and non-pharmacologic comfort measures 03/11/2023 0810 by Donne Hazel, LPN Outcome: Progressing 03/11/2023 0751 by Donne Hazel, LPN Outcome: Progressing   Problem: Health Behavior/Discharge Planning: Goal: Ability to manage health-related needs will improve 03/11/2023 0810 by Donne Hazel, LPN Outcome: Progressing 03/11/2023 0751 by Donne Hazel, LPN Outcome: Progressing   Problem: Clinical Measurements: Goal: Ability to maintain clinical measurements within normal limits will improve 03/11/2023 0810 by Donne Hazel, LPN Outcome: Progressing 03/11/2023 0751 by Donne Hazel, LPN Outcome: Progressing Goal: Will remain free from infection 03/11/2023 0810 by Donne Hazel, LPN Outcome: Progressing 03/11/2023 0751 by Donne Hazel, LPN Outcome: Progressing Goal: Diagnostic test results will improve 03/11/2023 0810 by Donne Hazel, LPN Outcome: Progressing 03/11/2023 0751 by Donne Hazel, LPN Outcome: Progressing Goal: Respiratory complications will improve 03/11/2023 0810 by Donne Hazel, LPN Outcome: Progressing 03/11/2023 0751 by Donne Hazel, LPN Outcome: Progressing Goal: Cardiovascular complication will be avoided 03/11/2023 0810 by Donne Hazel, LPN Outcome: Progressing 03/11/2023 0751 by Donne Hazel, LPN Outcome: Progressing   Problem: Activity: Goal: Risk for activity intolerance will decrease 03/11/2023 0810 by Donne Hazel, LPN Outcome: Progressing 03/11/2023 0751 by Donne Hazel, LPN Outcome: Progressing   Problem: Nutrition: Goal: Adequate nutrition will be maintained 03/11/2023 0810 by Donne Hazel, LPN Outcome: Progressing 03/11/2023 0751 by Donne Hazel, LPN Outcome: Progressing   Problem: Coping: Goal: Level of anxiety will decrease 03/11/2023 0810 by Donne Hazel, LPN Outcome:  Progressing 03/11/2023 0751 by Donne Hazel, LPN Outcome: Progressing   Problem: Elimination: Goal: Will not experience complications related to bowel motility 03/11/2023 0810 by Donne Hazel, LPN Outcome: Progressing 03/11/2023 0751 by Donne Hazel, LPN Outcome: Progressing Goal: Will not experience complications related to urinary retention 03/11/2023 0810 by Donne Hazel, LPN Outcome: Progressing 03/11/2023 0751 by Donne Hazel, LPN Outcome: Progressing   Problem: Pain Management: Goal: General experience of comfort will improve 03/11/2023 0810 by Donne Hazel, LPN Outcome: Progressing 03/11/2023 0751 by Donne Hazel, LPN Outcome: Progressing   Problem: Safety: Goal: Ability to remain free from injury will improve 03/11/2023 0810 by Donne Hazel, LPN Outcome: Progressing 03/11/2023 0751 by Donne Hazel, LPN Outcome: Progressing   Problem: Skin Integrity: Goal: Risk for impaired skin integrity will decrease 03/11/2023 0810 by Donne Hazel, LPN Outcome: Progressing 03/11/2023 0751 by Donne Hazel, LPN Outcome: Progressing   Problem: Education: Goal: Knowledge of condition will improve 03/11/2023 0810 by Donne Hazel, LPN Outcome: Progressing 03/11/2023 0751 by Donne Hazel, LPN Outcome: Progressing Goal: Individualized Educational Video(s) 03/11/2023 0810 by Donne Hazel, LPN Outcome: Progressing 03/11/2023 0751 by Donne Hazel, LPN Outcome: Progressing Goal: Individualized Newborn Educational Video(s) 03/11/2023 0810 by Donne Hazel, LPN Outcome: Progressing 03/11/2023 0751 by Donne Hazel, LPN Outcome: Progressing   Problem: Activity: Goal: Will verbalize the importance of balancing activity with adequate rest periods 03/11/2023 0810 by Donne Hazel, LPN Outcome: Progressing 03/11/2023 0751 by Donne Hazel, LPN Outcome: Progressing Goal: Ability to tolerate increased activity will improve 03/11/2023 0810 by Donne Hazel,  LPN Outcome: Progressing 03/11/2023 0751 by Donne Hazel, LPN Outcome: Progressing   Problem: Coping: Goal: Ability to identify and utilize available resources and services will improve 03/11/2023 0810 by Donne Hazel, LPN Outcome: Progressing 03/11/2023 0751 by Adam Phenix  A, LPN Outcome: Progressing   Problem: Life Cycle: Goal: Chance of risk for complications during the postpartum period will decrease 03/11/2023 0810 by Donne Hazel, LPN Outcome: Progressing 03/11/2023 0751 by Donne Hazel, LPN Outcome: Progressing   Problem: Role Relationship: Goal: Ability to demonstrate positive interaction with newborn will improve 03/11/2023 0810 by Donne Hazel, LPN Outcome: Progressing 03/11/2023 0751 by Donne Hazel, LPN Outcome: Progressing   Problem: Skin Integrity: Goal: Demonstration of wound healing without infection will improve 03/11/2023 0810 by Donne Hazel, LPN Outcome: Progressing 03/11/2023 0751 by Donne Hazel, LPN Outcome: Progressing

## 2023-03-11 NOTE — Lactation Note (Signed)
This note was copied from a baby's chart. Lactation Consultation Note  Patient Name: Boy Maripaz Glickstein [id number] Date: 03/11/2023 Age:25 hours  Reason for consult: Follow-up assessment;Primapara;1st time breastfeeding;Term;Breastfeeding assistance  P1, [redacted]w[redacted]d, 4% weight loss  Upon arrival to room, mother states baby just breast fed for 20 minutes. "Marny Lowenstein" is still showing feeding cues and mother was receptive to assistance with breastfeeding. Basic breastfeeding education with positioning, obtaining a deep latch and doing breast compression during the feeding to increase intake of colostrum. Mother was able to hand expression lots of colostrum prior to latch. Baby latched well with audible swallows.   Mother encouraged to latch baby with feeding cues, place baby skin to skin if not latching, and call for assistance with breastfeeding as needed. Anticipate as baby approaches 24 hours of age, baby will breastfeed more often, 8-12 plus times in 24 hours and may "cluster feed".     Feeding Mother's Current Feeding Choice: Breast Milk  LATCH Score Latch: Grasps breast easily, tongue down, lips flanged, rhythmical sucking.  Audible Swallowing: Spontaneous and intermittent  Type of Nipple: Everted at rest and after stimulation  Comfort (Breast/Nipple): Soft / non-tender  Hold (Positioning): Assistance needed to correctly position infant at breast and maintain latch.  LATCH Score: 9     Interventions Interventions: Breast feeding basics reviewed;Assisted with latch;Skin to skin;Hand express;Breast compression;Support pillows;Adjust position;Position options;Education    Consult Status Consult Status: Follow-up Date: 03/12/23 Follow-up type: In-patient    Christella Hartigan M 03/11/2023, 9:50 AM

## 2023-03-11 NOTE — Lactation Note (Signed)
This note was copied from a baby's chart. Lactation Consultation Note  Patient Name: Helen Foster [id number] Date: 03/11/2023 Age:25 hours Reason for consult: Initial assessment;Primapara;Term Baby hasn't been interested in feeding for this feeding. Baby is sleepy. Newborn feeding habits, behavior, STS, I&O, positioning, support reviewed. Mom encouraged to feed baby 8-12 times/24 hours and with feeding cues.  Encouraged mom to try to feed w/cues or at lease every three hours. Encouraged to call for assistance as needed.  Maternal Data Has patient been taught Hand Expression?: Yes Does the patient have breastfeeding experience prior to this delivery?: No  Feeding    LATCH Score Latch: Too sleepy or reluctant, no latch achieved, no sucking elicited.  Audible Swallowing: None  Type of Nipple: Everted at rest and after stimulation  Comfort (Breast/Nipple): Soft / non-tender  Hold (Positioning): Full assist, staff holds infant at breast  LATCH Score: 4   Lactation Tools Discussed/Used    Interventions Interventions: Breast feeding basics reviewed;Assisted with latch;Skin to skin;Breast massage;Hand express;Breast compression;Adjust position;Support pillows;Position options;Education;LC Services brochure  Discharge    Consult Status Consult Status: Follow-up Date: 03/11/23 Follow-up type: In-patient    Dequante Tremaine, Diamond Nickel 03/11/2023, 1:23 AM

## 2023-03-12 MED ORDER — IRON (FERROUS SULFATE) 325 (65 FE) MG PO TABS: 325.0000 mg | ORAL_TABLET | ORAL | 1 refills | Status: AC

## 2023-03-12 MED ORDER — LAMOTRIGINE 100 MG PO TABS
100.0000 mg | ORAL_TABLET | Freq: Once | ORAL | Status: AC
  Administered 2023-03-12: 100 mg via ORAL
  Filled 2023-03-12: qty 1

## 2023-03-12 MED ORDER — IBUPROFEN 600 MG PO TABS: 600.0000 mg | ORAL_TABLET | Freq: Four times a day (QID) | ORAL | 1 refills | Status: AC | PRN

## 2023-03-12 NOTE — Discharge Summary (Addendum)
Postpartum Discharge Summary  Date of Service updated      Patient Name: Helen Foster DOB: Jun 24, 1995 MRN: [id number]  Date of admission: 03/10/2023 Delivery date:03/10/2023 Delivering provider: Truett Perna A Date of discharge: 03/12/2023  Admitting diagnosis: Normal labor [O80, Z37.9] Intrauterine pregnancy: [redacted]w[redacted]d     Secondary diagnosis:  Principal Problem:   Normal labor  Additional problems: amenia of acute blood loss with no clinical significance    Discharge diagnosis: Term Pregnancy Delivered and Anemia                                              Post partum procedures: n/a Augmentation: AROM and Pitocin Complications: None  Hospital course: Induction of Labor With Vaginal Delivery   27 y.o. yo G1P1001 at [redacted]w[redacted]d was admitted to the hospital 03/10/2023 for induction of labor.  Indication for induction: Favorable cervix at term.  Patient had an labor course complicated by n/a Membrane Rupture Time/Date: 11:20 AM,03/10/2023  Delivery Method:Vaginal, Spontaneous Operative Delivery:N/A Episiotomy: None Lacerations:  2nd degree;Perineal Details of delivery can be found in separate delivery note.  Patient had a postpartum course complicated by n/a. Patient is discharged home 03/12/23.  Newborn Data: Birth date:03/10/2023 Birth time:6:58 PM Gender:Female Living status:Living Apgars:9 ,9  Weight:3060 g  Magnesium Sulfate received: No BMZ received: No Rhophylac:N/A MMR:No T-DaP:Given prenatally Flu: Yes RSV Vaccine received: No Transfusion:No Immunizations administered: There is no immunization history for the selected administration types on file for this patient.  Physical exam  Vitals:   03/11/23 1015 03/11/23 1300 03/11/23 2112 03/12/23 0743  BP: 126/72 132/77 127/81 119/75  Pulse: 82 82 90 87  Resp: 18 18 18 16   Temp: 98.2 F (36.8 C) 98 F (36.7 C) (!) 97.4 F (36.3 C) 98.7 F (37.1 C)  TempSrc: Oral Oral Oral Oral  SpO2: 100% 99%  100%   Weight:      Height:       General: alert, cooperative, and no distress Lochia: appropriate Uterine Fundus: firm Incision: N/A DVT Evaluation: No evidence of DVT seen on physical exam. Calf/Ankle edema is present Labs: Lab Results  Component Value Date   WBC 18.3 (H) 03/11/2023   HGB 8.7 (L) 03/11/2023   HCT 28.9 (L) 03/11/2023   MCV 84.0 03/11/2023   PLT 235 03/11/2023      Latest Ref Rng & Units 12/22/2020   11:19 AM  CMP  Glucose 70 - 99 mg/dL 782   BUN 6 - 20 mg/dL 16   Creatinine 9.56 - 1.00 mg/dL 2.13   Sodium 086 - 578 mmol/L 140   Potassium 3.5 - 5.1 mmol/L 3.3   Chloride 98 - 111 mmol/L 105   CO2 22 - 32 mmol/L 25   Calcium 8.9 - 10.3 mg/dL 9.4   Total Protein 6.5 - 8.1 g/dL 7.9   Total Bilirubin 0.3 - 1.2 mg/dL 0.4   Alkaline Phos 38 - 126 U/L 78   AST 15 - 41 U/L 27   ALT 0 - 44 U/L 20    Edinburgh Score:    03/11/2023   10:15 AM  Edinburgh Postnatal Depression Scale Screening Tool  I have been able to laugh and see the funny side of things. 0  I have looked forward with enjoyment to things. 0  I have blamed myself unnecessarily when things went wrong. 3  I have  been anxious or worried for no good reason. 0  I have felt scared or panicky for no good reason. 0  Things have been getting on top of me. 0  I have been so unhappy that I have had difficulty sleeping. 0  I have felt sad or miserable. 0  I have been so unhappy that I have been crying. 0  The thought of harming myself has occurred to me. 0  Edinburgh Postnatal Depression Scale Total 3      After visit meds:     Discharge home in stable condition Infant Feeding: Breast Infant Disposition:home with mother Discharge instruction: per After Visit Summary and Postpartum booklet. Activity: Advance as tolerated. Pelvic rest for 6 weeks.  Diet: routine diet Anticipated Birth Control: Unsure Postpartum Appointment:6 weeks Additional Postpartum F/U: Postpartum Depression checkup Future  Appointments:No future appointments. Follow up Visit:  Follow-up Information     Ob/Gyn, Nestor Ramp. Schedule an appointment as soon as possible for a visit in 6 week(s).   Why: For postpartum visit Contact information: [address] [place] [address] [phone]                     03/12/2023 Cathrine Muster, DO

## 2023-03-12 NOTE — Progress Notes (Addendum)
CSW received consult for hx of Anxiety and Depression.  CSW met with MOB to offer support and complete assessment. CSW entered the room, introduced herself and acknowledged that FOB was present. MOB gave CSW verbal permission to speak about anything while FOB was present. CSW explained her role and the reason for the visit. CSW observed MOB bonding/feeding the infant during the assessment.  CSW inquired about MOB's mental health history. MOB reported being diagnosed with anxiety and depression; at 27 years old due to situational family issues. MOB reported as she got older she was diagnosed with BPD (Borderline Personality disorder). MOB reported participating in therapy; however she found the support not helpful. MOB reported being prescribed Lamotrigine 150mg  and Fluoxetine 20mg  and finds this regiment to helpful and will adjust as needed during the PP period, along side of her Psychiatrist that will follow her closely. MOB reported also utilizing coping skills for support; which included beginning or continuing an art project and stepping back/analyzing a situation. MOB reported her supports as FOB, mom and her 2 best friends. CSW provided education regarding the baby blues period vs. perinatal mood disorders, discussed treatment and gave resources for mental health follow up if concerns arise.  CSW recommends self-evaluation during the postpartum time period using the New Mom Checklist from Postpartum Progress and encouraged MOB to contact a medical professional if symptoms are noted at any time. CSW assessed for safety with MOB SI and HI; MOB denied all. CSW did not assess for DV; FOB was present.  CSW asked MOB has she selected a pediatrician for the infant's follow up visits; MOB said [facility] - [place].  MOB reported having all essential items for the infant including a carseat, bassinet and crib for safe sleeping. CSW provided review of Sudden Infant Death Syndrome (SIDS) precautions.    CSW  identifies no further need for intervention and no barriers to discharge at this time.  Enos Fling, Theresia Majors Clinical Social Worker [phone]

## 2023-03-12 NOTE — Discharge Instructions (Signed)
Call office with any concerns [phone]

## 2023-03-12 NOTE — Progress Notes (Signed)
Post Partum Day 2 Subjective: no complaints, up ad lib, voiding, tolerating PO, + flatus, and lochia mild. She denies HA, CP or SOB. She requested taking her dose of lamictal today - had declined since admission. She is bonding well with baby - breastfeeding. Feels ready for discharge to home today   Objective: Blood pressure 119/75, pulse 87, temperature 98.7 F (37.1 C), temperature source Oral, resp. rate 16, height 5\' 1"  (1.549 m), weight 78 kg, SpO2 100%, unknown if currently breastfeeding.  Physical Exam:  General: alert, cooperative, and no distress Lochia: appropriate Uterine Fundus: firm Incision: n/a DVT Evaluation: No evidence of DVT seen on physical exam. Calf/Ankle edema is present.  Recent Labs    03/10/23 0934 03/11/23 0548  HGB 10.7* 8.7*  HCT 33.9* 28.9*    Assessment/Plan: Discharge home and Breastfeeding Instructions reviewed - 6 week pp visit advised    LOS: 2 days   Cherica Heiden W Caliph [place], DO 03/12/2023, 10:49 AM

## 2023-03-12 NOTE — Progress Notes (Signed)
Patient would like her Lamictal reordered. Called Dr. Mindi Slicker who states she will order. Earl Gala, Linda Hedges [place]

## 2023-03-12 NOTE — Lactation Note (Signed)
This note was copied from a baby's chart. Lactation Consultation Note  Patient Name: Helen Foster [id number] Date: 03/12/2023 Age:25 hours Reason for consult: Follow-up assessment;1st time breastfeeding;Primapara;Term;Infant weight loss;Breastfeeding assistance (6 % weight loss) Per mom baby recently fed at the breast and had 10 ml of formula.  Baby not settling down and still showing signs of hunger.  LC offered to assist to latch and mom receptive.  LC reviewed BF basics and latching with the Football position.  Worked on depth and baby fed 10 mins with swallows , which increased with warm compress. After baby fell asleep, mom released the suction and the nipple well rounded.  LC reviewed BF D/C teaching and the [facility] resources.  Per Parents feel a lot better.   Maternal Data Has patient been taught Hand Expression?: Yes (several drops)  Feeding Mother's Current Feeding Choice: Breast Milk and Formula  LATCH Score Latch: Repeated attempts needed to sustain latch, nipple held in mouth throughout feeding, stimulation needed to elicit sucking reflex.  Audible Swallowing: Spontaneous and intermittent  Type of Nipple: Everted at rest and after stimulation  Comfort (Breast/Nipple): Soft / non-tender  Hold (Positioning): Assistance needed to correctly position infant at breast and maintain latch.  LATCH Score: 8   Lactation Tools Discussed/Used Tools:  (per mom has a hand pump at home) Pump Education: Milk Storage  Interventions Interventions: Breast feeding basics reviewed;Assisted with latch;Skin to skin;Breast massage;Hand express;Pre-pump if needed;Reverse pressure;Breast compression;Adjust position;Support pillows;Position options;Shells;Hand pump;Education;LC Services brochure;CDC Guidelines for Breast Pump Cleaning  Discharge Discharge Education: Engorgement and breast care;Warning signs for feeding baby Pump: Personal;Manual;DEBP  Consult Status Consult Status:  Complete Date: 03/12/23    Kathrin Greathouse 03/12/2023, 11:39 AM

## 2023-03-20 ENCOUNTER — Telehealth (HOSPITAL_COMMUNITY): Payer: Self-pay

## 2023-03-20 NOTE — Telephone Encounter (Signed)
03/20/2023 1001  Name: Maloree Boas MRN: [id number] DOB: [birth year]  Reason for Call:  Transition of Care Hospital Discharge Call  Contact Status: Patient Contact Status: Message  Language assistant needed:          Follow-Up Questions:    Inocente Salles Postnatal Depression Scale:  In the Past 7 Days:    PHQ2-9 Depression Scale:     Discharge Follow-up:    Post-discharge interventions: NA          Signature  Signe Colt
